# Patient Record
Sex: Female | Born: 1935 | Race: Black or African American | Hispanic: No | Smoking: Never smoker
Health system: Southern US, Community
[De-identification: ages and names within clinical notes are randomized; demographics above are authoritative.]

## PROBLEM LIST (undated history)

## (undated) DIAGNOSIS — I10 Essential (primary) hypertension: Secondary | ICD-10-CM

## (undated) DIAGNOSIS — I639 Cerebral infarction, unspecified: Secondary | ICD-10-CM

## (undated) DIAGNOSIS — E119 Type 2 diabetes mellitus without complications: Secondary | ICD-10-CM

## (undated) HISTORY — PX: ABDOMINAL HYSTERECTOMY: SHX81

---

## 2018-06-16 ENCOUNTER — Emergency Department (HOSPITAL_COMMUNITY)
Admission: EM | Admit: 2018-06-16 | Discharge: 2018-06-16 | Disposition: A | Discharge status: Home / Self Care | Payer: Medicare Other | Attending: Emergency Medicine | Admitting: Emergency Medicine

## 2018-06-16 ENCOUNTER — Emergency Department (HOSPITAL_COMMUNITY): Payer: Medicare Other

## 2018-06-16 ENCOUNTER — Encounter (HOSPITAL_COMMUNITY): Payer: Self-pay | Admitting: Emergency Medicine

## 2018-06-16 ENCOUNTER — Other Ambulatory Visit: Payer: Self-pay

## 2018-06-16 DIAGNOSIS — E119 Type 2 diabetes mellitus without complications: Secondary | ICD-10-CM | POA: Insufficient documentation

## 2018-06-16 DIAGNOSIS — I10 Essential (primary) hypertension: Secondary | ICD-10-CM | POA: Insufficient documentation

## 2018-06-16 DIAGNOSIS — R1031 Right lower quadrant pain: Secondary | ICD-10-CM | POA: Insufficient documentation

## 2018-06-16 DIAGNOSIS — R109 Unspecified abdominal pain: Secondary | ICD-10-CM

## 2018-06-16 HISTORY — DX: Essential (primary) hypertension: I10

## 2018-06-16 HISTORY — DX: Type 2 diabetes mellitus without complications: E11.9

## 2018-06-16 HISTORY — DX: Cerebral infarction, unspecified: I63.9

## 2018-06-16 LAB — BASIC METABOLIC PANEL
ANION GAP: 8 (ref 5–15)
BUN: 28 mg/dL — ABNORMAL HIGH (ref 8–23)
CALCIUM: 9.1 mg/dL (ref 8.9–10.3)
CO2: 25 mmol/L (ref 22–32)
CREATININE: 1.57 mg/dL — AB (ref 0.44–1.00)
Chloride: 109 mmol/L (ref 98–111)
GFR, EST AFRICAN AMERICAN: 32 mL/min — AB (ref >60)
GFR, EST NON AFRICAN AMERICAN: 28 mL/min — AB (ref >60)
Glucose, Bld: 157 mg/dL — ABNORMAL HIGH (ref 70–99)
Potassium: 4.7 mmol/L (ref 3.5–5.1)
SODIUM: 142 mmol/L (ref 135–145)

## 2018-06-16 LAB — CBC
HCT: 37.8 % (ref 36.0–46.0)
Hemoglobin: 11.5 g/dL — ABNORMAL LOW (ref 12.0–15.0)
MCH: 25.7 pg — AB (ref 26.0–34.0)
MCHC: 30.4 g/dL (ref 30.0–36.0)
MCV: 84.4 fL (ref 78.0–100.0)
PLATELETS: 297 10*3/uL (ref 150–400)
RBC: 4.48 MIL/uL (ref 3.87–5.11)
RDW: 16.1 % — ABNORMAL HIGH (ref 11.5–15.5)
WBC: 7.8 10*3/uL (ref 4.0–10.5)

## 2018-06-16 LAB — URINALYSIS, ROUTINE W REFLEX MICROSCOPIC
Bilirubin Urine: NEGATIVE
Glucose, UA: NEGATIVE mg/dL
KETONES UR: NEGATIVE mg/dL
Nitrite: NEGATIVE
PROTEIN: 100 mg/dL — AB
Specific Gravity, Urine: 1.011 (ref 1.005–1.030)
pH: 7 (ref 5.0–8.0)

## 2018-06-16 MED ORDER — DICLOFENAC SODIUM 1 % TD GEL: 4.0000 g | Freq: Four times a day (QID) | TRANSDERMAL | 0 refills | Status: AC

## 2018-06-16 MED ORDER — KETOROLAC TROMETHAMINE 60 MG/2ML IM SOLN
15.0000 mg | Freq: Once | INTRAMUSCULAR | Status: AC
  Administered 2018-06-16: 15 mg via INTRAMUSCULAR
  Filled 2018-06-16: qty 2

## 2018-06-16 MED ORDER — MORPHINE SULFATE 15 MG PO TABS: 7.5000 mg | ORAL_TABLET | ORAL | 0 refills | Status: AC | PRN

## 2018-06-16 MED ORDER — OXYCODONE HCL 5 MG PO TABS
2.5000 mg | ORAL_TABLET | Freq: Once | ORAL | Status: AC
  Administered 2018-06-16: 2.5 mg via ORAL
  Filled 2018-06-16: qty 1

## 2018-06-16 MED ORDER — ACETAMINOPHEN 500 MG PO TABS
1000.0000 mg | ORAL_TABLET | Freq: Once | ORAL | Status: AC
  Administered 2018-06-16: 1000 mg via ORAL
  Filled 2018-06-16: qty 2

## 2018-06-16 NOTE — ED Triage Notes (Signed)
Pt reports left flank pain x1 week and left wrist pain and swelling for the past few weeks, has hx of same and was told it was arthritis. Pt denies any urinary symptoms and no falls. Flank pain is worse with movement. No fevers or chills.

## 2018-06-16 NOTE — ED Provider Notes (Signed)
[facility] EMERGENCY DEPARTMENT Provider Note   CSN: [id number] Arrival date & time: 06/16/18  1113     History   Chief Complaint Chief Complaint  Patient presents with  . Flank Pain  . Wrist Pain    HPI Felicia Dougherty is a 84 y.o. female.  84 yo F with a chief complaint of right-sided low back pain.  This radiates down to the abdomen.  Described as sharp and shooting pain.  Worse with movement palpation twisting.  She states she has to walk bent over now.  Denies fevers or chills.  Denies vomiting.  Denies loss of bowel or bladder denies loss of perirectal sensation.  Denies unilateral numbness or weakness.  Still is able to ambulate.  Having some pain to the left wrist as well.  She denies recent instrumentation.  Denies history of prior surgery.  Denies history of cancer.  The history is provided by the patient.  Flank Pain  This is a new problem. The current episode started more than 2 days ago. The problem occurs constantly. The problem has been gradually worsening. Pertinent negatives include no chest pain, no headaches and no shortness of breath. The symptoms are aggravated by bending, twisting and walking. Nothing relieves the symptoms. She has tried nothing for the symptoms. The treatment provided no relief.  Wrist Pain  Pertinent negatives include no chest pain, no headaches and no shortness of breath.    Past Medical History:  Diagnosis Date  . Diabetes mellitus without complication ([place])   . Hypertension   . Stroke [facility])     There are no active problems to display for this patient.   Past Surgical History:  Procedure Laterality Date  . ABDOMINAL HYSTERECTOMY       OB History   None      Home Medications    Prior to Admission medications   Medication Sig Start Date End Date Taking? Authorizing Provider  diclofenac sodium (VOLTAREN) 1 % GEL Apply 4 g topically 4 (four) times daily. 06/16/18   Deno Etienne, DO  morphine (MSIR) 15 MG  tablet Take 0.5 tablets (7.5 mg total) by mouth every 4 (four) hours as needed for severe pain. 06/16/18   Deno Etienne, DO    Family History No family history on file.  Social History Social History   Tobacco Use  . Smoking status: Never Smoker  Substance Use Topics  . Alcohol use: Never    Frequency: Never  . Drug use: Not on file     Allergies   Patient has no allergy information on record.   Review of Systems Review of Systems  Constitutional: Negative for chills and fever.  HENT: Negative for congestion and rhinorrhea.   Eyes: Negative for redness and visual disturbance.  Respiratory: Negative for shortness of breath and wheezing.   Cardiovascular: Negative for chest pain and palpitations.  Gastrointestinal: Negative for nausea and vomiting.  Genitourinary: Positive for flank pain. Negative for dysuria and urgency.  Musculoskeletal: Positive for arthralgias and myalgias.  Skin: Negative for pallor and wound.  Neurological: Negative for dizziness and headaches.     Physical Exam Updated Vital Signs BP (!) 178/74   Pulse 64   Temp 98 F (36.7 C) (Oral)   Resp 16   SpO2 99%   Physical Exam  Constitutional: She is oriented to person, place, and time. She appears well-developed and well-nourished. No distress.  HENT:  Head: Normocephalic and atraumatic.  Eyes: Pupils are equal, round, and reactive  to light. EOM are normal.  Neck: Normal range of motion. Neck supple.  Cardiovascular: Normal rate and regular rhythm. Exam reveals no gallop and no friction rub.  No murmur heard. Pulmonary/Chest: Effort normal. She has no wheezes. She has no rales.  Abdominal: Soft. She exhibits no distension. There is no tenderness.  Musculoskeletal: She exhibits tenderness. She exhibits no edema.  Tender to palpation about the right paraspinal musculature.  No midline spinal tenderness.  Negative straight leg raise test bilaterally.  Pulse motor and sensation is intact distally.    Neurological: She is alert and oriented to person, place, and time.  Skin: Skin is warm and dry. She is not diaphoretic.  Psychiatric: She has a normal mood and affect. Her behavior is normal.  Nursing note and vitals reviewed.    ED Treatments / Results  Labs (all labs ordered are listed, but only abnormal results are displayed) Labs Reviewed  URINALYSIS, ROUTINE W REFLEX MICROSCOPIC - Abnormal; Notable for the following components:      Result Value   APPearance HAZY (*)    Hgb urine dipstick MODERATE (*)    Protein, ur 100 (*)    Leukocytes, UA SMALL (*)    RBC / HPF >50 (*)    Bacteria, UA RARE (*)    All other components within normal limits  BASIC METABOLIC PANEL - Abnormal; Notable for the following components:   Glucose, Bld 157 (*)    BUN 28 (*)    Creatinine, Ser 1.57 (*)    GFR calc non Af Amer 28 (*)    GFR calc Af Amer 32 (*)    All other components within normal limits  CBC - Abnormal; Notable for the following components:   Hemoglobin 11.5 (*)    MCH 25.7 (*)    RDW 16.1 (*)    All other components within normal limits    EKG None  Radiology Ct Lumbar Spine Wo Contrast  Result Date: 06/16/2018 CLINICAL DATA:  LEFT flank pain for 1 week EXAM: CT LUMBAR SPINE WITHOUT CONTRAST TECHNIQUE: Multidetector CT imaging of the lumbar spine was performed without intravenous contrast administration. Multiplanar CT image reconstructions were also generated. COMPARISON:  None FINDINGS: Segmentation: 5 non-rib-bearing lumbar type vertebra Alignment: Minimal anterolisthesis at L3-L4 and L4-L5 Vertebrae: Diffuse demineralization. Schmorl's nodes at inferior L1, superior and inferior L3, superior and inferior L4. Multilevel facet degenerative changes. Scattered mild disc spur space narrowing. Vacuum phenomenon at L1-L2, L2-L3, L3-L4, and L4-L5 discs. Scattered endplate spur formation with mild calcification of the anterior longitudinal ligament at the lower thoracic spine. Mild  sclerosis along the RIGHT SI joint within the medial RIGHT iliac bone. Paraspinal and other soft tissues: Paraspinal soft tissues grossly unremarkable. Disc levels: T12-L1, L1-L2: No abnormalities L2-L3: Diffuse disc bulge.  No focal disc herniation. L3-L4: Central acquired spinal stenosis, multifactorial, due to anterolisthesis, disc bulge / pseudo-disc, and ligamentum flavum thickening. No definite disc herniation. Probable BILATERAL lateral recess stenosis. L4-L5: Multifactorial central acquired spinal stenosis due to mild anterolisthesis, disc bulge / pseudo-disc, and ligamentum flavum thickening. Narrowing of the inferior aspects of the neural foramina by bulging disc. Probable mild BILATERAL lateral recess stenosis. L5-S1: Bulging disc.  No disc herniation or neural compression. IMPRESSION: Grade 1 anterolisthesis at L3-L4 and L4-L5 likely due to degenerative facet disease and accompanying mild degenerative disc disease. Multifactorial central acquired spinal stenosis at L3-L4 and L4-L5 as above, with likely lateral recess stenosis bilaterally at both levels. Cannot exclude neural compression at the  lateral recesses particularly L4-L5. Scattered degenerative disc and facet disease changes of lumbar spine as above. Electronically Signed   By: Lavonia Dana M.D.   On: 06/16/2018 22:06   Ct Renal Stone Study  Result Date: 06/16/2018 CLINICAL DATA:  LEFT flank pain for 1 week, flank pain worse with movement EXAM: CT ABDOMEN AND PELVIS WITHOUT CONTRAST TECHNIQUE: Multidetector CT imaging of the abdomen and pelvis was performed following the standard protocol without IV contrast. Sagittal and coronal MPR images reconstructed from axial data set. Oral contrast was not administered. COMPARISON:  None FINDINGS: Lower chest: Minimal atelectasis RIGHT lung base Hepatobiliary: Dependent calcified gallstones in gallbladder. Liver unremarkable. Pancreas: Atrophic pancreas without mass Spleen: Normal appearance  Adrenals/Urinary Tract: Adrenal glands normal appearance. Low attenuation mid RIGHT renal lesion 3.7 x 3.3 cm consistent with cyst. Lobulated LEFT kidney without focal mass. No urinary tract calcification, hydronephrosis, or hydroureter. Bladder unremarkable. Stomach/Bowel: Normal appendix. Colon interposition between liver and diaphragm. Scattered stool in colon. Sigmoid diverticulosis. Single small bowel loop extends into an umbilical hernia without associated wall thickening or bowel obstruction. Stomach and bowel loops otherwise unremarkable. Vascular/Lymphatic: Atherosclerotic calcifications aorta and iliac arteries. Minimal pericardial effusion. Mitral annular calcification. No adenopathy. Reproductive: Uterus surgically absent. Nonvisualization of ovaries. Other: No free air or free fluid.  No inflammatory process. Musculoskeletal: Bones demineralized. IMPRESSION: No evidence of urinary tract calcification or obstruction. RIGHT renal cyst 3.7 cm diameter. Sigmoid diverticulosis without evidence of diverticulitis. Cholelithiasis. Umbilical hernia containing a nonobstructed small bowel loop. Minimal pericardial effusion. Electronically Signed   By: Lavonia Dana M.D.   On: 06/16/2018 21:22    Procedures Procedures (including critical care time)  Medications Ordered in ED Medications  acetaminophen (TYLENOL) tablet 1,000 mg (1,000 mg Oral Given 06/16/18 2021)  oxyCODONE (Oxy IR/ROXICODONE) immediate release tablet 2.5 mg (2.5 mg Oral Given 06/16/18 2021)  ketorolac (TORADOL) injection 15 mg (15 mg Intramuscular Given 06/16/18 2021)     Initial Impression / Assessment and Plan / ED Course  I have reviewed the triage vital signs and the nursing notes.  Pertinent labs & imaging results that were available during my care of the patient were reviewed by me and considered in my medical decision making (see chart for details).     84 yo F with a chief complaint of right flank pain.  This is worse with  movement palpation sitting up.  Feels it is radiating down into her abdomen.  [place] and is here visiting her children.  My exam with diffuse right-sided low back pain.  No midline spinal tenderness.  Some mild right upper quadrant tenderness.  Will obtain CT scans.  Treat her pain.  I suspect this is most likely muscular skeletal by history and physical.  Imaging based on age.  CT imaging without intra-abdominal pathology.  The patient does have some degenerative changes and possibly some spinal stenosis and may be some neurocompression.  Patient is feeling better on reassessment.  Will discharge with PCP follow-up.  11:09 PM:  I have discussed the diagnosis/risks/treatment options with the patient and family and believe the pt to be eligible for discharge home to follow-up with PCP. We also discussed returning to the ED immediately if new or worsening sx occur. We discussed the sx which are most concerning (e.g., sudden worsening pain, fever, weakness or numbness to the legs) that necessitate immediate return. Medications administered to the patient during their visit and any new prescriptions provided to the patient are  listed below.  Medications given during this visit Medications  acetaminophen (TYLENOL) tablet 1,000 mg (1,000 mg Oral Given 06/16/18 2021)  oxyCODONE (Oxy IR/ROXICODONE) immediate release tablet 2.5 mg (2.5 mg Oral Given 06/16/18 2021)  ketorolac (TORADOL) injection 15 mg (15 mg Intramuscular Given 06/16/18 2021)      The patient appears reasonably screen and/or stabilized for discharge and I doubt any other medical condition or other [facility] requiring further screening, evaluation, or treatment in the ED at this time prior to discharge.    Final Clinical Impressions(s) / ED Diagnoses   Final diagnoses:  Right flank pain    ED Discharge Orders         Ordered    morphine (MSIR) 15 MG tablet  Every 4 hours PRN     06/16/18 2304    diclofenac sodium  (VOLTAREN) 1 % GEL  4 times daily     06/16/18 [place], Fiore Detjen, DO 06/16/18 2309

## 2018-06-16 NOTE — Discharge Instructions (Signed)
Take 4 over the counter ibuprofen tablets 3 times a day or 2 over-the-counter naproxen tablets twice a day for pain. Also take tylenol 1000mg(2 extra strength) four times a day.    

## 2018-09-18 IMAGING — CT CT RENAL STONE PROTOCOL
2 of 4 series · 16 of 46 positions shown, 18 images · non-contrast
Comparison: None

CLINICAL DATA: LEFT flank pain for 1 week, flank pain worse with
movement

EXAM:
CT ABDOMEN AND PELVIS WITHOUT CONTRAST
TECHNIQUE: Multidetector CT imaging of the abdomen and pelvis was performed
following the standard protocol without IV contrast. Sagittal and
coronal MPR images reconstructed from axial data set. Oral contrast
was not administered.

[Series 5: cor · coronal · 0.81mm/px · 3 of 122 slices shown]
[im 41/122  soft-tissue]
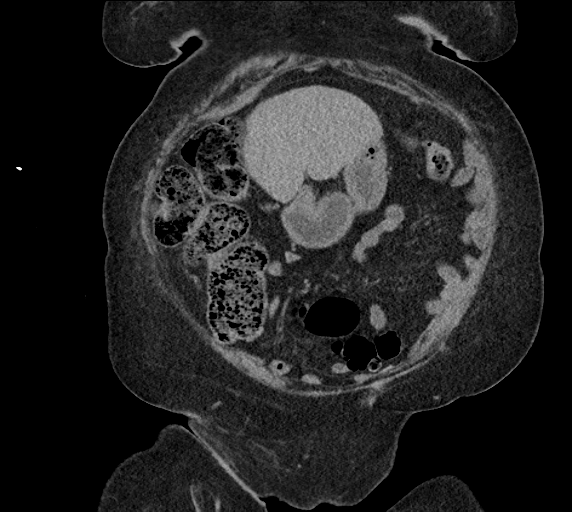
[im 54/122  soft-tissue]
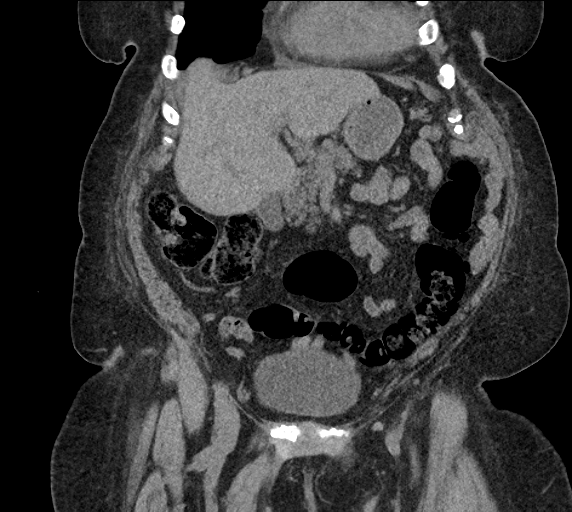
[im 68/122  soft-tissue]
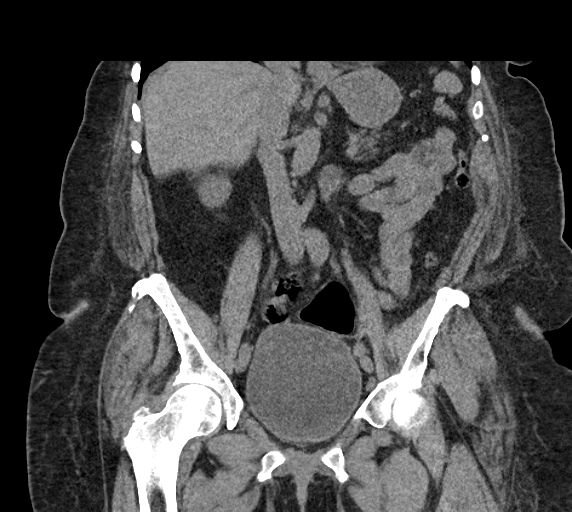

[Series 9: ap without · axial · non-contrast · 0.84mm/px · z∈[-563,-188]mm · 13 of 83 slices shown, 15 images]
[im 4/83  soft-tissue]
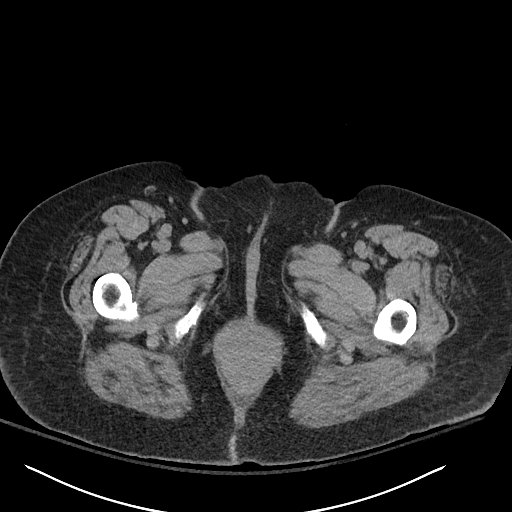
[im 4/83  bone]
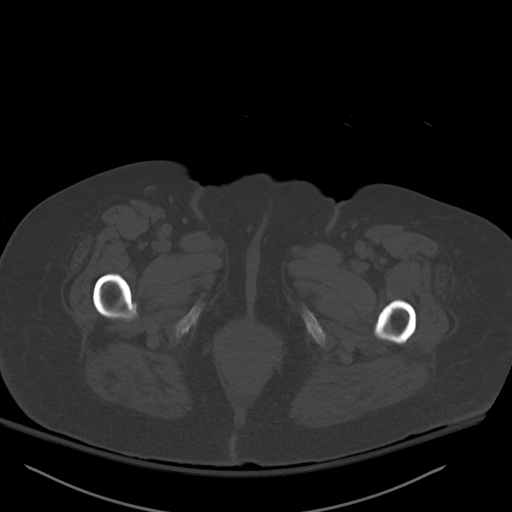
[im 12/83  soft-tissue]
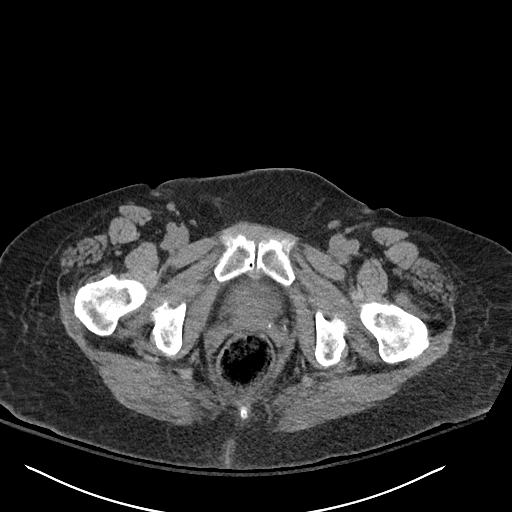
[im 16/83  soft-tissue]
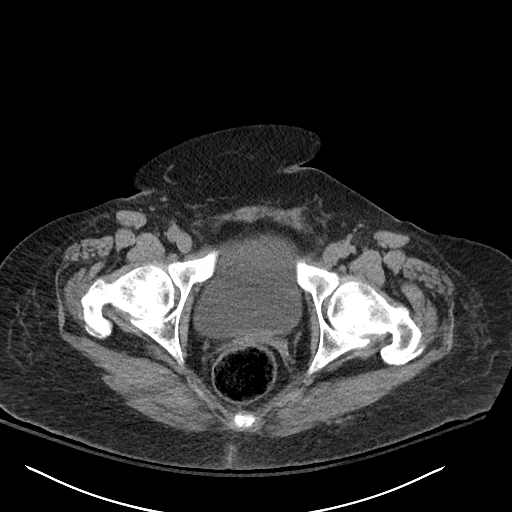
[im 24/83  soft-tissue]
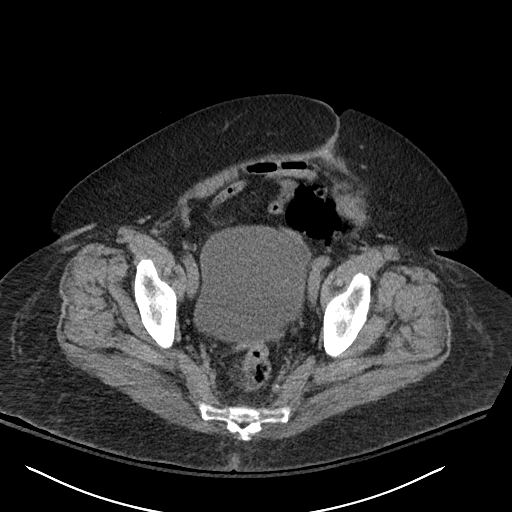
[im 28/83  soft-tissue]
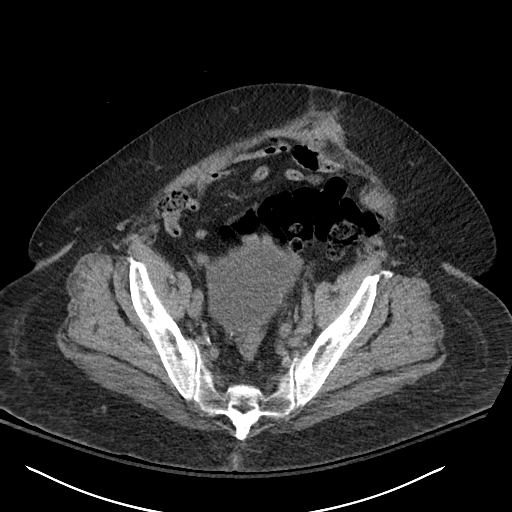
[im 36/83  soft-tissue]
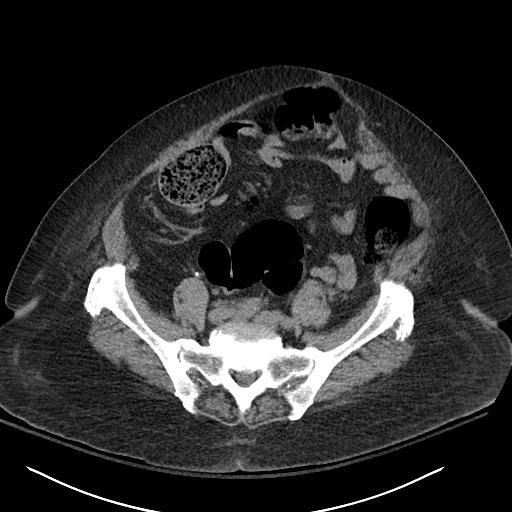
[im 43/83  soft-tissue]
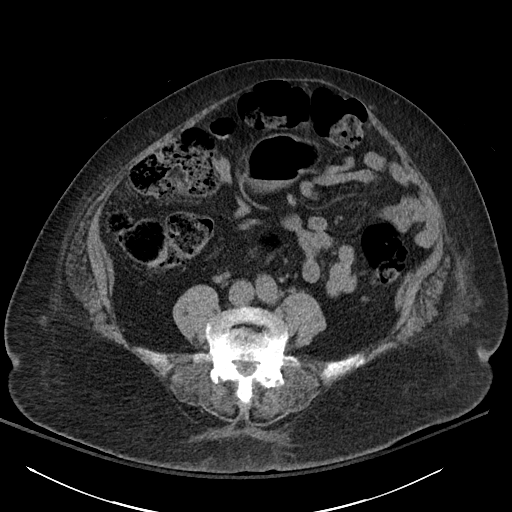
[im 47/83  soft-tissue]
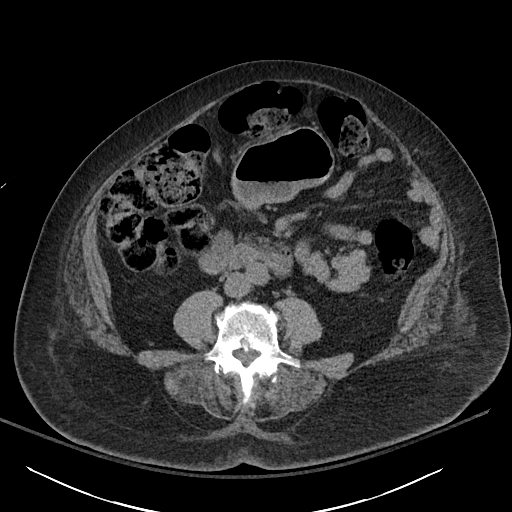
[im 55/83  soft-tissue]
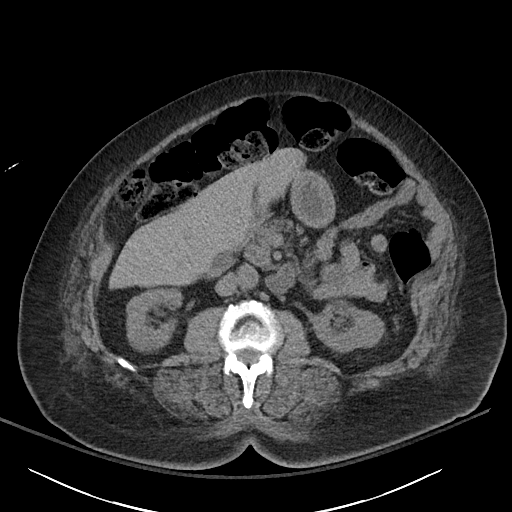
[im 55/83  bone]
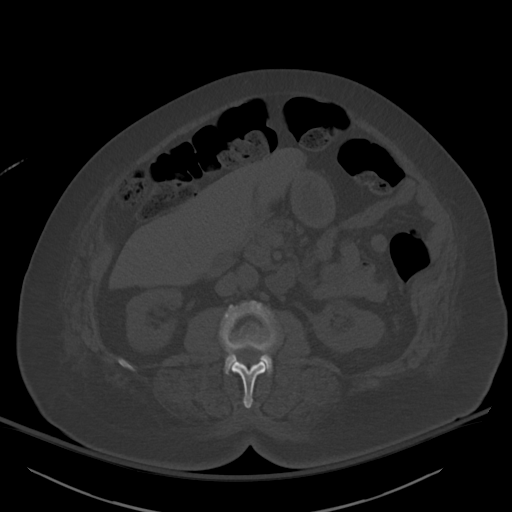
[im 59/83  soft-tissue]
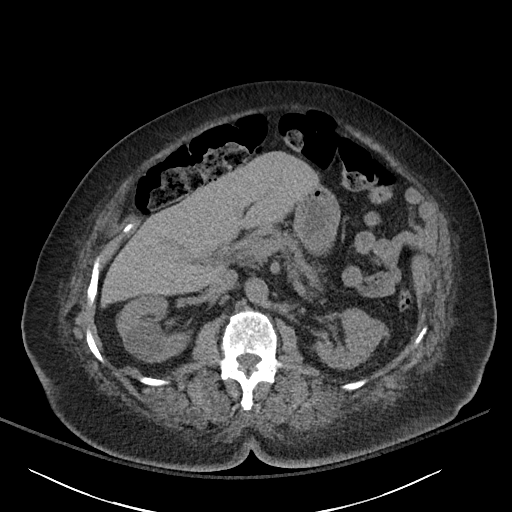
[im 67/83  soft-tissue]
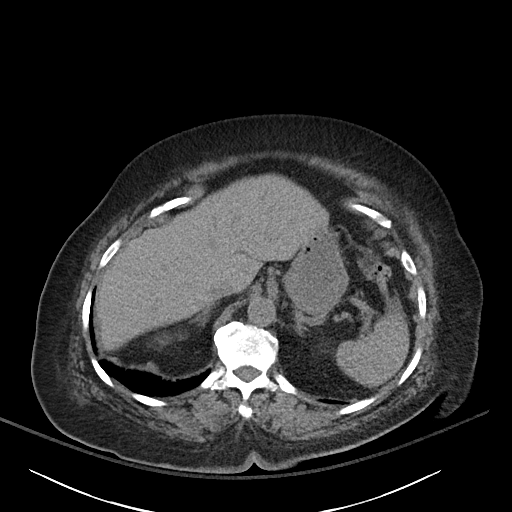
[im 71/83  soft-tissue]
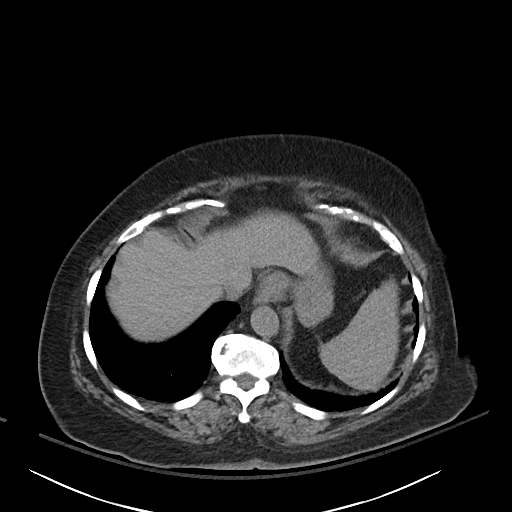
[im 79/83  soft-tissue]
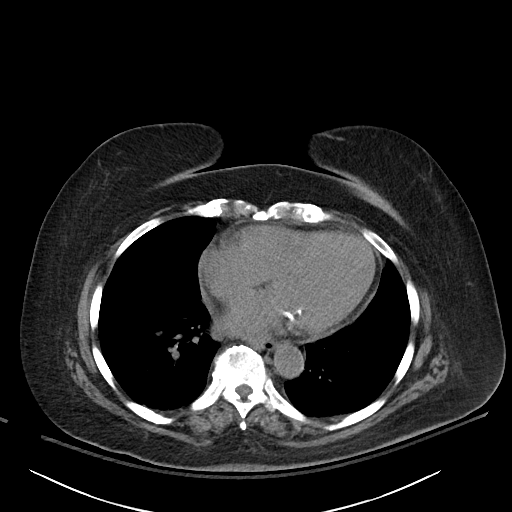

[16 of 46 positions shown; findings below may reference images not displayed]

FINDINGS: Lower chest: Minimal atelectasis RIGHT lung base

Hepatobiliary: Dependent calcified gallstones in gallbladder. Liver
unremarkable.

Pancreas: Atrophic pancreas without mass

Spleen: Normal appearance

Adrenals/Urinary Tract: Adrenal glands normal appearance. Low
attenuation mid RIGHT renal lesion 3.7 x 3.3 cm consistent with
cyst. Lobulated LEFT kidney without focal mass. No urinary tract
calcification, hydronephrosis, or hydroureter. Bladder unremarkable.

Stomach/Bowel: Normal appendix. Colon interposition between liver
and diaphragm. Scattered stool in colon. Sigmoid diverticulosis.
Single small bowel loop extends into an umbilical hernia without
associated wall thickening or bowel obstruction. Stomach and bowel
loops otherwise unremarkable.

Vascular/Lymphatic: Atherosclerotic calcifications aorta and iliac
arteries. Minimal pericardial effusion. Mitral annular
calcification. No adenopathy.

Reproductive: Uterus surgically absent. Nonvisualization of ovaries.

Other: No free air or free fluid.  No inflammatory process.

Musculoskeletal: Bones demineralized.
IMPRESSION: No evidence of urinary tract calcification or obstruction.

RIGHT renal cyst 3.7 cm diameter.

Sigmoid diverticulosis without evidence of diverticulitis.

Cholelithiasis.

Umbilical hernia containing a nonobstructed small bowel loop.

Minimal pericardial effusion.

## 2022-07-06 ENCOUNTER — Other Ambulatory Visit: Payer: Self-pay

## 2022-07-06 ENCOUNTER — Encounter: Payer: Self-pay | Admitting: Family

## 2022-07-06 ENCOUNTER — Ambulatory Visit (INDEPENDENT_AMBULATORY_CARE_PROVIDER_SITE_OTHER): Payer: Medicare Other | Admitting: Family

## 2022-07-06 VITALS — BP 128/62 | HR 90 | Temp 97.9°F | Resp 17 | Ht 62.0 in | Wt 177.6 lb

## 2022-07-06 DIAGNOSIS — Z7689 Persons encountering health services in other specified circumstances: Secondary | ICD-10-CM

## 2022-07-06 DIAGNOSIS — K5901 Slow transit constipation: Secondary | ICD-10-CM

## 2022-07-06 DIAGNOSIS — I129 Hypertensive chronic kidney disease with stage 1 through stage 4 chronic kidney disease, or unspecified chronic kidney disease: Secondary | ICD-10-CM

## 2022-07-06 DIAGNOSIS — M542 Cervicalgia: Secondary | ICD-10-CM | POA: Diagnosis not present

## 2022-07-06 DIAGNOSIS — E785 Hyperlipidemia, unspecified: Secondary | ICD-10-CM | POA: Diagnosis not present

## 2022-07-06 DIAGNOSIS — N184 Chronic kidney disease, stage 4 (severe): Secondary | ICD-10-CM

## 2022-07-06 DIAGNOSIS — E1122 Type 2 diabetes mellitus with diabetic chronic kidney disease: Secondary | ICD-10-CM

## 2022-07-06 DIAGNOSIS — L989 Disorder of the skin and subcutaneous tissue, unspecified: Secondary | ICD-10-CM

## 2022-07-06 DIAGNOSIS — G8929 Other chronic pain: Secondary | ICD-10-CM

## 2022-07-06 DIAGNOSIS — L602 Onychogryphosis: Secondary | ICD-10-CM

## 2022-07-06 DIAGNOSIS — R300 Dysuria: Secondary | ICD-10-CM

## 2022-07-06 DIAGNOSIS — M25512 Pain in left shoulder: Secondary | ICD-10-CM

## 2022-07-06 DIAGNOSIS — E559 Vitamin D deficiency, unspecified: Secondary | ICD-10-CM

## 2022-07-06 NOTE — Progress Notes (Unsigned)
Provider: Webb Silversmith Inaara Tye FNP-C   Pcp, No  Patient Care Team: Pcp, No as PCP - General  Extended Emergency Contact Information Primary Emergency Contact: [place] Mobile Phone: [phone] Relation: Granddaughter Secondary Emergency Contact: Salman,Arintha Address: [place] [address] Johnnette Litter of [place] Phone: [phone] Mobile Phone: [phone] Relation: Daughter  Code Status:  Full Code  Goals of care: Advanced Directive information    07/06/2022    2:19 PM  Advanced Directives  Does Patient Have a Medical Advance Directive? No  Would patient like information on creating a medical advance directive? No - Patient declined     Chief Complaint  Patient presents with   New Patient (Initial Visit)    Patient is here to discussed establishing care. Patient having issues with her neck and having some pain    HPI:  Pt is a 88 y.o. female seen today establish care here at [facility] for medical management of chronic diseases  Here from [place] staying with her daughter. Vitamin D - on supplement   Hyperlipidemia - on Rosuvastatin   Hypertension - No home blood pressure. denies any headache,dizziness,vision changes,fatigue,chest   tightness,palpitation,chest pain or shortness of breath.     Neck  pain - radiate to left shoulder and shoots to the head.Had two CT scan which was negative.on Morphine 15 mg tablet   Hx of CVA - affected left side 5 yrs ago .she was discharged to rehab Left leg feels heavy   Constipation - on miralax   Had fasting labs July 11,2023   Type 2 DM - check blood sugar 120's-190's   Chronic kidney disease  stage 4 - has been following up with Nephrology.Had left side pain but resolved.   No hx of smoking or use of alcohol.       Past Medical History:  Diagnosis Date   Diabetes mellitus without complication ([place])    Hypertension    Stroke [facility])    Past Surgical  History:  Procedure Laterality Date   ABDOMINAL HYSTERECTOMY      No Known Allergies  Allergies as of 07/06/2022   No Known Allergies      Medication List        Accurate as of July 06, 2022 11:59 PM. If you have any questions, ask your nurse or doctor.          acarbose 100 MG tablet Commonly known as: PRECOSE Take 100 mg by mouth 2 (two) times daily.   acetaminophen 500 MG tablet Commonly known as: TYLENOL Take 500 mg by mouth every 6 (six) hours as needed.   amLODipine 10 MG tablet Commonly known as: NORVASC Take 1 tablet by mouth daily.   aspirin EC 81 MG tablet Take 1 tablet by mouth daily.   diclofenac sodium 1 % Gel Commonly known as: VOLTAREN Apply 4 g topically 4 (four) times daily.   Docusate Sodium 100 MG capsule Take 100 mg by mouth.   folic acid 1 MG tablet Commonly known as: FOLVITE Take 1 tablet by mouth daily.   furosemide 40 MG tablet Commonly known as: LASIX Take 40 mg by mouth daily.   hydrALAZINE 50 MG tablet Commonly known as: APRESOLINE Take 50 mg by mouth 2 (two) times daily. Take 2 tablets by mouth 2 times a day.   losartan 100 MG tablet Commonly known as: COZAAR Take 1 tablet by mouth daily.   morphine  15 MG tablet Commonly known as: MSIR Take 0.5 tablets (7.5 mg total) by mouth every 4 (four) hours as needed for severe pain.   Multi Vitamin/Minerals Tabs Take 1 tablet by mouth daily.   polyethylene glycol 17 g packet Commonly known as: MIRALAX / GLYCOLAX Take 1 packet by mouth daily.   rosuvastatin 5 MG tablet Commonly known as: CRESTOR Take by mouth.   Vitamin D (Ergocalciferol) 1.25 MG (50000 UNIT) Caps capsule Commonly known as: DRISDOL Take 1 capsule by mouth once a week.        Review of Systems  Constitutional:  Negative for appetite change, chills, fatigue, fever and unexpected weight change.  HENT:  Negative for congestion, dental problem, ear discharge, ear pain, facial swelling, hearing loss,  nosebleeds, postnasal drip, rhinorrhea, sinus pressure, sinus pain, sneezing, sore throat, tinnitus and trouble swallowing.   Eyes:  Negative for pain, discharge, redness, itching and visual disturbance.  Respiratory:  Negative for cough, chest tightness, shortness of breath and wheezing.   Cardiovascular:  Negative for chest pain, palpitations and leg swelling.  Gastrointestinal:  Positive for constipation. Negative for abdominal distention, abdominal pain, blood in stool, diarrhea, nausea and vomiting.  Endocrine: Negative for cold intolerance, heat intolerance, polydipsia, polyphagia and polyuria.  Genitourinary:  Negative for difficulty urinating, dysuria, flank pain, frequency and urgency.  Musculoskeletal:  Positive for neck pain. Negative for arthralgias, back pain, gait problem, joint swelling, myalgias and neck stiffness.  Skin:  Negative for color change, pallor, rash and wound.       Skin lesion on neck   Neurological:  Negative for dizziness, syncope, speech difficulty, weakness, light-headedness, numbness and headaches.  Hematological:  Does not bruise/bleed easily.  Psychiatric/Behavioral:  Negative for agitation, behavioral problems, confusion, hallucinations, self-injury, sleep disturbance and suicidal ideas. The patient is not nervous/anxious.     Immunization History  Administered Date(s) Administered   DTP 01/29/2012   Influenza Inj Mdck Quad Pf 08/08/2017, 07/21/2019   Influenza,inj,Quad PF,6+ Mos 08/26/2018, 09/22/2020, 08/02/2021   Influenza,inj,quad, With Preservative 06/25/2016   PFIZER(Purple Top)SARS-COV-2 Vaccination 12/04/2019, 12/25/2019, 07/19/2020   Pneumococcal Conjugate-13 03/03/2014   Pneumococcal Polysaccharide-23 10/09/2001   Zoster Recombinat (Shingrix) 01/11/2020, 04/19/2020   Zoster, Live 02/18/2013   Pertinent  Health Maintenance Due  Topic Date Due   DEXA SCAN  Never done   INFLUENZA VACCINE  05/08/2022      06/16/2018   11:55 AM 07/06/2022     2:18 PM  Fall Risk  Falls in the past year?  0  Was there an injury with Fall?  0  Fall Risk Category Calculator  0  Fall Risk Category  Low  Patient Fall Risk Level Moderate fall risk Low fall risk  Patient at Risk for Falls Due to  History of fall(s);Impaired balance/gait  Fall risk Follow up  Falls evaluation completed   Functional Status Survey:    Vitals:   07/06/22 1420  BP: 128/62  Pulse: 90  Resp: 17  Temp: 97.9 F (36.6 C)  TempSrc: Temporal  SpO2: 98%  Weight: 177 lb 9.6 oz (80.6 kg)  Height: 5' 2"  (1.575 m)   Body mass index is 32.48 kg/m. Physical Exam Vitals reviewed.  Constitutional:      General: She is not in acute distress.    Appearance: Normal appearance. She is obese. She is not ill-appearing or diaphoretic.  HENT:     Head: Normocephalic.     Right Ear: Tympanic membrane, ear canal and external ear normal. There is no  impacted cerumen.     Left Ear: Tympanic membrane, ear canal and external ear normal. There is no impacted cerumen.     Nose: Nose normal. No congestion or rhinorrhea.     Mouth/Throat:     Mouth: Mucous membranes are moist.     Pharynx: Oropharynx is clear. No oropharyngeal exudate or posterior oropharyngeal erythema.  Eyes:     General: No scleral icterus.       Right eye: No discharge.        Left eye: No discharge.     Extraocular Movements: Extraocular movements intact.     Conjunctiva/sclera: Conjunctivae normal.     Pupils: Pupils are equal, round, and reactive to light.  Neck:     Vascular: No carotid bruit.  Cardiovascular:     Rate and Rhythm: Normal rate and regular rhythm.     Pulses: Normal pulses.     Heart sounds: Normal heart sounds. No murmur heard.    No friction rub. No gallop.  Pulmonary:     Effort: Pulmonary effort is normal. No respiratory distress.     Breath sounds: Normal breath sounds. No wheezing, rhonchi or rales.  Chest:     Chest wall: No tenderness.  Abdominal:     General: Bowel sounds are  normal. There is no distension.     Palpations: Abdomen is soft. There is no mass.     Tenderness: There is no abdominal tenderness. There is no right CVA tenderness, left CVA tenderness, guarding or rebound.  Musculoskeletal:        General: No swelling or tenderness. Normal range of motion.     Cervical back: Normal range of motion. No rigidity or tenderness.     Right lower leg: No edema.     Left lower leg: No edema.  Feet:     Right foot:     Skin integrity: No skin breakdown, erythema, warmth or callus.     Toenail Condition: Right toenails are abnormally thick and long.     Left foot:     Skin integrity: No skin breakdown, erythema, warmth or callus.     Toenail Condition: Left toenails are abnormally thick and long.  Lymphadenopathy:     Cervical: No cervical adenopathy.  Skin:    General: Skin is warm and dry.     Coloration: Skin is not pale.     Findings: Lesion present. No bruising, erythema or rash.     Comments: Pea size skin lesion black in color,raised non-tender to palpation and without any drainage.  Neurological:     Mental Status: She is alert and oriented to person, place, and time.     Cranial Nerves: No cranial nerve deficit.     Sensory: No sensory deficit.     Motor: No weakness.     Coordination: Coordination normal.     Gait: Gait normal.  Psychiatric:        Mood and Affect: Mood normal.        Speech: Speech normal.        Behavior: Behavior normal.        Thought Content: Thought content normal.        Judgment: Judgment normal.     Labs reviewed: Recent Labs    07/06/22 1534  NA 140  K 4.3  CL 102  CO2 25  GLUCOSE 157*  BUN 71*  CREATININE 3.07*  CALCIUM 9.2   Recent Labs    07/06/22 1534  AST 25  ALT 14  BILITOT 0.3  PROT 7.1   Recent Labs    07/06/22 1534  WBC 7.6  NEUTROABS 4,613  HGB 10.2*  HCT 32.1*  MCV 84.5  PLT 275   Lab Results  Component Value Date   TSH 0.37 (L) 07/06/2022   Lab Results  Component  Value Date   HGBA1C 6.0 (H) 07/06/2022   Lab Results  Component Value Date   CHOL 127 07/06/2022   HDL 56 07/06/2022   LDLCALC 52 07/06/2022   TRIG 108 07/06/2022   CHOLHDL 2.3 07/06/2022    Significant Diagnostic Results in last 30 days:  No results found.  Assessment/Plan  1. Encounter to establish care Available records reviewed. We will need to immunization records then will update on epic.Recommended fasting blood work  2. Type 2 DM with CKD stage 4 and hypertension (HCC) No latest hemoglobin A1c for review we will obtain lab work -Continue Acarbose  -Continue on statin and aspirin for cardiovascular event prevention Continue on ARB for renal protection - Hemoglobin A1c - Microalbumin / creatinine urine ratio  3. Hyperlipidemia LDL goal <100 Continue on rosuvastatin - Lipid Panel  4. Neck pain on left side Suspect possible radiation from the neck to shoulder or shoulder and shooting to the neck -Continue on over-the-counter analgesic Will refer to orthopedic for further evaluation - Ambulatory referral to Orthopedic Surgery  5. Chronic left shoulder pain Continue on current pain regimen - Ambulatory referral to Orthopedic Surgery  6. Slow transit constipation Continue on MiraLAX and docusate  7. Benign hypertension with CKD (chronic kidney disease) stage IV (HCC) Blood pressure well controlled -Continue on losartan, hydralazine and amlodipine -Continue on statin and aspirin - CBC with Differential/Platelet - CMP with eGFR([facility]) - TSH  8. Vitamin D deficiency Continue on vitamin D 50,000 units weekly  9. Overgrown toenails No long thick toenails.  Will refer to podiatrist to trim toenails - Ambulatory referral to Podiatry  10. Skin lesion of neck Has increased in size.  No signs of infection will refer to dermatologist for evaluation - Ambulatory referral to Dermatology  11. Dysuria Afebrile - Culture, Urine  Family/ staff Communication:  Reviewed plan of care with patient and daughter verbalized understanding  Labs/tests ordered:  - CBC with Differential/Platelet - CMP with eGFR([facility]) - TSH - Hgb A1C - Lipid panel - Urine Culture  - MicroAlbumin -creatinine ratio    Next Appointment : Return in about 6 months (around 01/04/2023) for medical mangement of chronic issues.Sandrea Hughs, NP

## 2022-07-07 LAB — CBC WITH DIFFERENTIAL/PLATELET
Absolute Monocytes: 920 cells/uL (ref 200–950)
Basophils Absolute: 38 cells/uL (ref 0–200)
Basophils Relative: 0.5 %
Eosinophils Absolute: 53 cells/uL (ref 15–500)
Eosinophils Relative: 0.7 %
HCT: 32.1 % — ABNORMAL LOW (ref 35.0–45.0)
Hemoglobin: 10.2 g/dL — ABNORMAL LOW (ref 11.7–15.5)
Lymphs Abs: 1976 cells/uL (ref 850–3900)
MCH: 26.8 pg — ABNORMAL LOW (ref 27.0–33.0)
MCHC: 31.8 g/dL — ABNORMAL LOW (ref 32.0–36.0)
MCV: 84.5 fL (ref 80.0–100.0)
MPV: 9.2 fL (ref 7.5–12.5)
Monocytes Relative: 12.1 %
Neutro Abs: 4613 cells/uL (ref 1500–7800)
Neutrophils Relative %: 60.7 %
Platelets: 275 10*3/uL (ref 140–400)
RBC: 3.8 10*6/uL (ref 3.80–5.10)
RDW: 14.2 % (ref 11.0–15.0)
Total Lymphocyte: 26 %
WBC: 7.6 10*3/uL (ref 3.8–10.8)

## 2022-07-07 LAB — COMPLETE METABOLIC PANEL WITH GFR
AG Ratio: 1.4 (calc) (ref 1.0–2.5)
ALT: 14 U/L (ref 6–29)
AST: 25 U/L (ref 10–35)
Albumin: 4.1 g/dL (ref 3.6–5.1)
Alkaline phosphatase (APISO): 50 U/L (ref 37–153)
BUN/Creatinine Ratio: 23 (calc) — ABNORMAL HIGH (ref 6–22)
BUN: 71 mg/dL — ABNORMAL HIGH (ref 7–25)
CO2: 25 mmol/L (ref 20–32)
Calcium: 9.2 mg/dL (ref 8.6–10.4)
Chloride: 102 mmol/L (ref 98–110)
Creat: 3.07 mg/dL — ABNORMAL HIGH (ref 0.60–0.95)
Globulin: 3 g/dL (calc) (ref 1.9–3.7)
Glucose, Bld: 157 mg/dL — ABNORMAL HIGH (ref 65–99)
Potassium: 4.3 mmol/L (ref 3.5–5.3)
Sodium: 140 mmol/L (ref 135–146)
Total Bilirubin: 0.3 mg/dL (ref 0.2–1.2)
Total Protein: 7.1 g/dL (ref 6.1–8.1)
eGFR: 13 mL/min/{1.73_m2} — ABNORMAL LOW (ref >=60)

## 2022-07-07 LAB — TSH: TSH: 0.37 mIU/L — ABNORMAL LOW (ref 0.40–4.50)

## 2022-07-07 LAB — LIPID PANEL
Cholesterol: 127 mg/dL (ref <200)
HDL: 56 mg/dL (ref >=50)
LDL Cholesterol (Calc): 52 mg/dL (calc)
Non-HDL Cholesterol (Calc): 71 mg/dL (calc) (ref <130)
Total CHOL/HDL Ratio: 2.3 (calc) (ref <5.0)
Triglycerides: 108 mg/dL (ref <150)

## 2022-07-07 LAB — HEMOGLOBIN A1C
Hgb A1c MFr Bld: 6 % of total Hgb — ABNORMAL HIGH (ref <5.7)
Mean Plasma Glucose: 126 mg/dL
eAG (mmol/L): 7 mmol/L

## 2022-07-11 DIAGNOSIS — G8929 Other chronic pain: Secondary | ICD-10-CM | POA: Insufficient documentation

## 2022-07-11 DIAGNOSIS — K5901 Slow transit constipation: Secondary | ICD-10-CM | POA: Insufficient documentation

## 2022-07-11 DIAGNOSIS — E559 Vitamin D deficiency, unspecified: Secondary | ICD-10-CM | POA: Insufficient documentation

## 2022-07-11 DIAGNOSIS — I129 Hypertensive chronic kidney disease with stage 1 through stage 4 chronic kidney disease, or unspecified chronic kidney disease: Secondary | ICD-10-CM | POA: Insufficient documentation

## 2022-07-11 DIAGNOSIS — M542 Cervicalgia: Secondary | ICD-10-CM | POA: Insufficient documentation

## 2022-07-11 DIAGNOSIS — E785 Hyperlipidemia, unspecified: Secondary | ICD-10-CM | POA: Insufficient documentation

## 2022-07-12 ENCOUNTER — Other Ambulatory Visit: Payer: Self-pay

## 2022-07-12 DIAGNOSIS — E039 Hypothyroidism, unspecified: Secondary | ICD-10-CM

## 2022-07-12 DIAGNOSIS — D649 Anemia, unspecified: Secondary | ICD-10-CM

## 2022-07-12 DIAGNOSIS — R7989 Other specified abnormal findings of blood chemistry: Secondary | ICD-10-CM

## 2022-07-12 DIAGNOSIS — N184 Chronic kidney disease, stage 4 (severe): Secondary | ICD-10-CM

## 2022-07-12 LAB — URINE CULTURE
MICRO NUMBER:: 13994628
SPECIMEN QUALITY:: ADEQUATE

## 2022-07-12 LAB — MICROALBUMIN / CREATININE URINE RATIO
Creatinine, Urine: 140 mg/dL (ref 20–275)
Microalb Creat Ratio: 29 mcg/mg creat (ref <30)
Microalb, Ur: 4 mg/dL

## 2022-07-12 NOTE — Addendum Note (Signed)
Addended by: Leigh Aurora C on: 07/12/2022 12:00 PM   Modules accepted: Orders

## 2022-07-12 NOTE — Addendum Note (Signed)
Addended by: Logan Bores on: 07/12/2022 11:38 AM   Modules accepted: Orders

## 2022-07-13 ENCOUNTER — Telehealth: Payer: Self-pay | Admitting: Family

## 2022-07-13 ENCOUNTER — Ambulatory Visit (INDEPENDENT_AMBULATORY_CARE_PROVIDER_SITE_OTHER): Payer: Medicare Other | Admitting: Podiatry

## 2022-07-13 ENCOUNTER — Other Ambulatory Visit: Payer: Self-pay

## 2022-07-13 DIAGNOSIS — E119 Type 2 diabetes mellitus without complications: Secondary | ICD-10-CM

## 2022-07-13 DIAGNOSIS — B351 Tinea unguium: Secondary | ICD-10-CM | POA: Diagnosis not present

## 2022-07-13 DIAGNOSIS — L84 Corns and callosities: Secondary | ICD-10-CM | POA: Diagnosis not present

## 2022-07-13 DIAGNOSIS — M79674 Pain in right toe(s): Secondary | ICD-10-CM

## 2022-07-13 DIAGNOSIS — M79675 Pain in left toe(s): Secondary | ICD-10-CM

## 2022-07-13 DIAGNOSIS — I129 Hypertensive chronic kidney disease with stage 1 through stage 4 chronic kidney disease, or unspecified chronic kidney disease: Secondary | ICD-10-CM

## 2022-07-13 DIAGNOSIS — N184 Chronic kidney disease, stage 4 (severe): Secondary | ICD-10-CM

## 2022-07-13 DIAGNOSIS — E1122 Type 2 diabetes mellitus with diabetic chronic kidney disease: Secondary | ICD-10-CM

## 2022-07-13 DIAGNOSIS — M2141 Flat foot [pes planus] (acquired), right foot: Secondary | ICD-10-CM | POA: Diagnosis not present

## 2022-07-13 DIAGNOSIS — M2142 Flat foot [pes planus] (acquired), left foot: Secondary | ICD-10-CM

## 2022-07-13 MED ORDER — CIPROFLOXACIN HCL 500 MG PO TABS: 500.0000 mg | ORAL_TABLET | Freq: Every day | ORAL | 0 refills | Status: AC

## 2022-07-13 NOTE — Telephone Encounter (Signed)
Called patient and lab results were discussed and understood with granddaughter Ashlee Deais. Medication Cipro 500mg  sent into pharmacy as directed. Message routed back to PCP Ngetich, Nelda Bucks, NP as Juluis Rainier. No further action is required.

## 2022-07-13 NOTE — Telephone Encounter (Signed)
Final urine culture results indicates > 100,000 colonies bacteria indicating urinary tract infection recommend starting on Cipro 500 mg tablet one by mouth daily ( every 24 hours due to Kidney failure ) x 7 days/

## 2022-07-18 ENCOUNTER — Ambulatory Visit: Payer: Self-pay

## 2022-07-18 ENCOUNTER — Encounter: Payer: Self-pay | Admitting: Surgery

## 2022-07-18 ENCOUNTER — Ambulatory Visit (INDEPENDENT_AMBULATORY_CARE_PROVIDER_SITE_OTHER): Payer: Medicare Other | Admitting: Surgery

## 2022-07-18 ENCOUNTER — Encounter: Payer: Self-pay | Admitting: Podiatry

## 2022-07-18 VITALS — BP 165/63 | HR 78 | Ht 62.0 in | Wt 177.6 lb

## 2022-07-18 DIAGNOSIS — M7542 Impingement syndrome of left shoulder: Secondary | ICD-10-CM

## 2022-07-18 DIAGNOSIS — M542 Cervicalgia: Secondary | ICD-10-CM

## 2022-07-18 NOTE — Progress Notes (Signed)
ANNUAL DIABETIC FOOT EXAM  Subjective: Felicia Dougherty presents today for diabetic foot evaluation.  Chief Complaint  Patient presents with   Nail Problem    Diabetic foot care BS-did not check today A1C-Do not know PCP-Dinah Ngetich PCP VST- Last Friday    Patient confirms h/o diabetes.  Patient denies any h/o foot wounds.  Patient relates diabetic foot pain on occasion.  Risk factors: diabetes, h/o CVA, CKD, hyperlipidemia.  Ngetich, Nelda Bucks, NP is patient's PCP. Last visit was June 05, 2022.  Past Medical History:  Diagnosis Date   Diabetes mellitus without complication ([place])    Hypertension    Stroke [facility])    Patient Active Problem List   Diagnosis Date Noted   Type 2 DM with CKD stage 4 and hypertension ([place]) 07/11/2022   Hyperlipidemia LDL goal <100 07/11/2022   Neck pain on left side 07/11/2022   Chronic left shoulder pain 07/11/2022   Slow transit constipation 07/11/2022   Benign hypertension with CKD (chronic kidney disease) stage IV ([place]) 07/11/2022   Vitamin D deficiency 07/11/2022   Past Surgical History:  Procedure Laterality Date   ABDOMINAL HYSTERECTOMY     Current Outpatient Medications on File Prior to Visit  Medication Sig Dispense Refill   acarbose (PRECOSE) 100 MG tablet Take 100 mg by mouth 2 (two) times daily.     acetaminophen (TYLENOL) 500 MG tablet Take 500 mg by mouth every 6 (six) hours as needed.     amLODipine (NORVASC) 10 MG tablet Take 1 tablet by mouth daily.     aspirin EC 81 MG tablet Take 1 tablet by mouth daily.     diclofenac sodium (VOLTAREN) 1 % GEL Apply 4 g topically 4 (four) times daily. 100 g 0   Docusate Sodium 100 MG capsule Take 100 mg by mouth.     folic acid (FOLVITE) 1 MG tablet Take 1 tablet by mouth daily.     furosemide (LASIX) 40 MG tablet Take 40 mg by mouth daily.     hydrALAZINE (APRESOLINE) 50 MG tablet Take 50 mg by mouth 2 (two) times daily. Take 2 tablets by mouth 2 times a day.     losartan (COZAAR)  100 MG tablet Take 1 tablet by mouth daily.     morphine (MSIR) 15 MG tablet Take 0.5 tablets (7.5 mg total) by mouth every 4 (four) hours as needed for severe pain. 5 tablet 0   Multiple Vitamins-Minerals (MULTI VITAMIN/MINERALS) TABS Take 1 tablet by mouth daily.     polyethylene glycol (MIRALAX / GLYCOLAX) 17 g packet Take 1 packet by mouth daily.     rosuvastatin (CRESTOR) 5 MG tablet Take by mouth.     Vitamin D, Ergocalciferol, (DRISDOL) 1.25 MG (50000 UNIT) CAPS capsule Take 1 capsule by mouth once a week.     No current facility-administered medications on file prior to visit.    No Known Allergies Social History   Occupational History   Not on file  Tobacco Use   Smoking status: Never   Smokeless tobacco: Not on file  Substance and Sexual Activity   Alcohol use: Never   Drug use: Never   Sexual activity: Not Currently   History reviewed. No pertinent family history. Immunization History  Administered Date(s) Administered   DTP 01/29/2012   Influenza Inj Mdck Quad Pf 08/08/2017, 07/21/2019   Influenza,inj,Quad PF,6+ Mos 08/26/2018, 09/22/2020, 08/02/2021   Influenza,inj,quad, With Preservative 06/25/2016   PFIZER(Purple Top)SARS-COV-2 Vaccination 12/04/2019, 12/25/2019, 07/19/2020   Pneumococcal Conjugate-13 03/03/2014  Pneumococcal Polysaccharide-23 10/09/2001   Zoster Recombinat (Shingrix) 01/11/2020, 04/19/2020   Zoster, Live 02/18/2013     Review of Systems: Negative except as noted in the HPI.   Objective: There were no vitals filed for this visit.  Felicia Dougherty is a pleasant 88 y.o. female in NAD. AAO X 3.  Vascular Examination: Capillary refill time immediate b/l. Vascular status intact b/l with palpable pedal pulses. Pedal hair absent b/l. No pain with calf compression b/l. Skin temperature gradient WNL b/l. Trace edema noted BLE.  Neurological Examination: Sensation grossly intact b/l with 10 gram monofilament. Vibratory sensation intact b/l. Pt has  subjective symptoms of neuropathy.  Dermatological Examination: Pedal skin with normal turgor, texture and tone b/l. Toenails 1-5 b/l thick, discolored, elongated with subungual debris and pain on dorsal palpation. No open wounds b/l LE. No interdigital macerations noted b/l LE. Hyperkeratotic lesion(s) right heel.  No erythema, no edema, no drainage, no fluctuance.  Musculoskeletal Examination: Muscle strength 5/5 to all lower extremity muscle groups bilaterally. Pes planus deformity noted bilateral LE.  Radiographs: None  Last A1c:      Latest Ref Rng & Units 07/06/2022    3:34 PM  Hemoglobin A1C  Hemoglobin-A1c <5.7 % of total Hgb 6.0    Footwear Assessment: Does the patient wear appropriate shoes? Yes. Does the patient need inserts/orthotics? Yes.  ADA Risk Categorization: Low Risk :  Patient has all of the following: Intact protective sensation No prior foot ulcer  No severe deformity Pedal pulses present  Assessment: 1. Pain due to onychomycosis of toenails of both feet   2. Callus   3. Type 2 DM with CKD stage 4 and hypertension ([place])   4. Pes planus of both feet   5. Encounter for diabetic foot exam [facility])     Plan: -Patient was evaluated and treated. All patient's and/or POA's questions/concerns answered on today's visit. -Diabetic foot examination performed today. -Discussed and educated patient on diabetic foot care, especially with  regards to the vascular, neurological and musculoskeletal systems. -Patient to continue soft, supportive shoe gear daily. -Mycotic toenails 1-5 bilaterally were debrided in length and girth with sterile nail nippers and dremel without incident. -Callus(es) right heel pared utilizing rotary bur without complication or incident. Total number pared =1. -Patient/POA to call should there be question/concern in the interim. Return in about 3 months (around 10/13/2022).  Marzetta Board, DPM

## 2022-07-18 NOTE — Progress Notes (Signed)
   Office Visit Note   Patient: Felicia Dougherty           Date of Birth: [birth year]           MRN: [id number] Visit Date: 07/18/2022              Requested by: Sandrea Hughs, NP [address] [place],  [place] [address] PCP: Sandrea Hughs, NP   Assessment & Plan: Visit Diagnoses:  1. Neck pain on left side   2. Impingement syndrome of left shoulder     Plan: In hopes given patient relief of left shoulder pain offered injection.  Patient consent left shoulder prepped with Betadine and subacromial Marcaine/Depo-Medrol injection was performed.  Tolerated without complication.  After sitting for few minutes did have good relief with anesthetic in place.  Follow-up with me in 4 weeks for recheck.  We will make decision at that time as to whether further imaging studies are indicated.  Follow-Up Instructions: Return in about 4 weeks (around 08/15/2022) for [facility] RECHECK LEFT NECK AND LEFT SHOULDER AFTER SHOULDER INJECTION.   Orders:  Orders Placed This Encounter  Procedures   Large Joint Inj   XR Cervical Spine 2 or 3 views   XR Shoulder Left   No orders of the defined types were placed in this encounter.     Procedures: Large Joint Inj: L subacromial bursa on 07/18/2022 3:56 PM Indications: pain Details: 25 G 1.5 in needle, posterior approach Medications: 3 mL lidocaine 1 %; 5 mL bupivacaine 0.25 %; 40 mg methylPREDNISolone acetate 40 MG/ML Outcome: tolerated well, no immediate complications Consent was given by the patient. Patient was prepped and draped in the usual sterile fashion.       Clinical Data: No additional findings.   Subjective: Chief Complaint  Patient presents with   Neck - Pain   Left Shoulder - Pain    HPI  Review of Systems   Objective: Vital Signs: BP (!) 165/63   Pulse 78   Ht 5\' 2"  (1.575 m)   Wt 177 lb 9.6 oz (80.6 kg)   BMI 32.48 kg/m   Physical Exam  Ortho Exam  Specialty Comments:  No specialty comments  available.  Imaging: No results found.   PMFS History: Patient Active Problem List   Diagnosis Date Noted   Type 2 DM with CKD stage 4 and hypertension ([place]) 07/11/2022   Hyperlipidemia LDL goal <100 07/11/2022   Neck pain on left side 07/11/2022   Chronic left shoulder pain 07/11/2022   Slow transit constipation 07/11/2022   Benign hypertension with CKD (chronic kidney disease) stage IV ([place]) 07/11/2022   Vitamin D deficiency 07/11/2022   Past Medical History:  Diagnosis Date   Diabetes mellitus without complication ([place])    Hypertension    Stroke [facility])     History reviewed. No pertinent family history.  Past Surgical History:  Procedure Laterality Date   ABDOMINAL HYSTERECTOMY     Social History   Occupational History   Not on file  Tobacco Use   Smoking status: Never   Smokeless tobacco: Not on file  Substance and Sexual Activity   Alcohol use: Never   Drug use: Never   Sexual activity: Not Currently

## 2022-07-19 ENCOUNTER — Other Ambulatory Visit: Payer: Self-pay

## 2022-07-19 DIAGNOSIS — R7989 Other specified abnormal findings of blood chemistry: Secondary | ICD-10-CM

## 2022-07-19 DIAGNOSIS — E039 Hypothyroidism, unspecified: Secondary | ICD-10-CM

## 2022-07-27 ENCOUNTER — Other Ambulatory Visit: Payer: Medicare Other

## 2022-07-30 ENCOUNTER — Other Ambulatory Visit: Payer: Medicare Other

## 2022-07-31 LAB — T3: T3, Total: 56 ng/dL — ABNORMAL LOW (ref 76–181)

## 2022-07-31 LAB — T4: T4, Total: 5.9 ug/dL (ref 5.1–11.9)

## 2022-07-31 LAB — TSH: TSH: 0.27 mIU/L — ABNORMAL LOW (ref 0.40–4.50)

## 2022-08-01 MED ORDER — LIDOCAINE HCL 1 % IJ SOLN
3.0000 mL | INTRAMUSCULAR | Status: AC | PRN
  Administered 2022-07-18: 3 mL

## 2022-08-01 MED ORDER — BUPIVACAINE HCL 0.25 % IJ SOLN
5.0000 mL | INTRAMUSCULAR | Status: AC | PRN
  Administered 2022-07-18: 5 mL via INTRA_ARTICULAR

## 2022-08-01 MED ORDER — METHYLPREDNISOLONE ACETATE 40 MG/ML IJ SUSP
40.0000 mg | INTRAMUSCULAR | Status: AC | PRN
  Administered 2022-07-18: 40 mg via INTRA_ARTICULAR

## 2022-08-02 ENCOUNTER — Other Ambulatory Visit: Payer: Self-pay | Admitting: Family

## 2022-08-02 DIAGNOSIS — R7989 Other specified abnormal findings of blood chemistry: Secondary | ICD-10-CM

## 2022-08-21 ENCOUNTER — Ambulatory Visit: Payer: Medicare Other | Admitting: Surgery

## 2023-01-04 ENCOUNTER — Ambulatory Visit: Payer: Medicare Other | Admitting: Family
